# Patient Record
Sex: Male | Born: 1980 | Race: White | Hispanic: No | Marital: Single
Health system: Southern US, Community
[De-identification: ages and names within clinical notes are randomized; demographics above are authoritative.]

## PROBLEM LIST (undated history)

## (undated) ENCOUNTER — Emergency Department (HOSPITAL_COMMUNITY): Admission: EM | Payer: Self-pay | Source: Home / Self Care

## (undated) DIAGNOSIS — I10 Essential (primary) hypertension: Secondary | ICD-10-CM

---

## 2004-05-26 ENCOUNTER — Emergency Department: Payer: Self-pay | Admitting: Emergency Medicine

## 2004-07-06 ENCOUNTER — Emergency Department: Payer: Self-pay | Admitting: General Practice

## 2004-09-04 ENCOUNTER — Emergency Department: Payer: Self-pay | Admitting: General Practice

## 2007-07-07 ENCOUNTER — Other Ambulatory Visit: Payer: Self-pay

## 2007-07-07 ENCOUNTER — Emergency Department: Payer: Self-pay | Admitting: Emergency Medicine

## 2008-01-06 ENCOUNTER — Other Ambulatory Visit: Payer: Self-pay

## 2008-01-06 ENCOUNTER — Inpatient Hospital Stay: Payer: Self-pay | Admitting: Internal Medicine

## 2008-01-06 ENCOUNTER — Ambulatory Visit: Payer: Self-pay | Admitting: Cardiology

## 2008-02-26 ENCOUNTER — Emergency Department (HOSPITAL_COMMUNITY): Admission: EM | Admit: 2008-02-26 | Discharge: 2008-02-26 | Payer: Self-pay | Admitting: Emergency Medicine

## 2008-03-24 ENCOUNTER — Other Ambulatory Visit: Payer: Self-pay

## 2008-03-24 ENCOUNTER — Inpatient Hospital Stay: Payer: Self-pay | Admitting: Unknown Physician Specialty

## 2011-06-18 ENCOUNTER — Emergency Department: Payer: Self-pay | Admitting: Emergency Medicine

## 2011-07-13 ENCOUNTER — Emergency Department: Payer: Self-pay | Admitting: *Deleted

## 2011-07-13 LAB — COMPREHENSIVE METABOLIC PANEL
Alkaline Phosphatase: 30 U/L — ABNORMAL LOW (ref 50–136)
Anion Gap: 9 (ref 7–16)
BUN: 12 mg/dL (ref 7–18)
Bilirubin,Total: 0.7 mg/dL (ref 0.2–1.0)
Co2: 30 mmol/L (ref 21–32)
Creatinine: 1.03 mg/dL (ref 0.60–1.30)
EGFR (African American): >60
EGFR (Non-African Amer.): >60
Osmolality: 287 (ref 275–301)
SGPT (ALT): 18 U/L

## 2011-07-13 LAB — CBC WITH DIFFERENTIAL/PLATELET
Basophil %: 0.4 %
Eosinophil %: 6 %
Eosinophil: 7 %
Lymphocytes: 30 %
MCH: 24.1 pg — ABNORMAL LOW (ref 26.0–34.0)
MCV: 76 fL — ABNORMAL LOW (ref 80–100)
Monocyte %: 10.1 %
Neutrophil %: 50.4 %
Platelet: 277 10*3/uL (ref 150–440)
Segmented Neutrophils: 57 %

## 2011-07-13 LAB — LIPASE, BLOOD: Lipase: 61 U/L — ABNORMAL LOW (ref 73–393)

## 2011-07-13 LAB — RETICULOCYTES
Absolute Retic Count: 0.126 10*6/uL — ABNORMAL HIGH (ref 0.024–0.084)
Reticulocyte: 2.3 % — ABNORMAL HIGH (ref 0.5–1.5)

## 2011-07-16 ENCOUNTER — Ambulatory Visit: Payer: Self-pay | Admitting: Urology

## 2011-07-16 LAB — CBC WITH DIFFERENTIAL/PLATELET
Basophil %: 0.2 %
Eosinophil %: 0.4 %
HGB: 13.4 g/dL (ref 13.0–18.0)
Lymphocyte %: 15.4 %
Lymphocytes: 20 %
MCH: 24 pg — ABNORMAL LOW (ref 26.0–34.0)
Monocyte #: 0.8 10*3/uL — ABNORMAL HIGH (ref 0.0–0.7)
Monocyte %: 6.7 %
Neutrophil %: 77.3 %
RBC: 5.57 10*6/uL (ref 4.40–5.90)

## 2011-07-16 LAB — RETICULOCYTES
Absolute Retic Count: 0.13 10*6/uL — ABNORMAL HIGH (ref 0.024–0.084)
Reticulocyte: 2.3 % — ABNORMAL HIGH (ref 0.5–1.5)

## 2011-07-16 LAB — URINALYSIS, COMPLETE
Bilirubin,UR: NEGATIVE
Ketone: NEGATIVE
Ph: 7 (ref 4.5–8.0)
Protein: NEGATIVE
RBC,UR: 1 /HPF (ref 0–5)
Specific Gravity: 1.01 (ref 1.003–1.030)
Squamous Epithelial: NONE SEEN

## 2011-07-16 LAB — COMPREHENSIVE METABOLIC PANEL
Albumin: 4.1 g/dL (ref 3.4–5.0)
Alkaline Phosphatase: 33 U/L — ABNORMAL LOW (ref 50–136)
Bilirubin,Total: 1.2 mg/dL — ABNORMAL HIGH (ref 0.2–1.0)
Co2: 28 mmol/L (ref 21–32)
Glucose: 92 mg/dL (ref 65–99)
SGPT (ALT): 15 U/L
Sodium: 141 mmol/L (ref 136–145)
Total Protein: 8.5 g/dL — ABNORMAL HIGH (ref 6.4–8.2)

## 2011-07-19 ENCOUNTER — Emergency Department: Payer: Self-pay | Admitting: Emergency Medicine

## 2012-05-20 ENCOUNTER — Emergency Department (HOSPITAL_COMMUNITY)
Admission: EM | Admit: 2012-05-20 | Discharge: 2012-05-21 | Disposition: A | Discharge status: Home / Self Care | Payer: Self-pay | Attending: Emergency Medicine | Admitting: Emergency Medicine

## 2012-05-20 ENCOUNTER — Emergency Department (HOSPITAL_COMMUNITY): Payer: Self-pay

## 2012-05-20 ENCOUNTER — Encounter (HOSPITAL_COMMUNITY): Payer: Self-pay | Admitting: *Deleted

## 2012-05-20 DIAGNOSIS — S1191XA Laceration without foreign body of unspecified part of neck, initial encounter: Secondary | ICD-10-CM

## 2012-05-20 DIAGNOSIS — X789XXA Intentional self-harm by unspecified sharp object, initial encounter: Secondary | ICD-10-CM | POA: Insufficient documentation

## 2012-05-20 DIAGNOSIS — S1190XA Unspecified open wound of unspecified part of neck, initial encounter: Secondary | ICD-10-CM | POA: Insufficient documentation

## 2012-05-20 HISTORY — DX: Essential (primary) hypertension: I10

## 2012-05-20 LAB — BASIC METABOLIC PANEL
BUN: 9 mg/dL (ref 6–23)
CO2: 27 mEq/L (ref 19–32)
Calcium: 9.4 mg/dL (ref 8.4–10.5)
Chloride: 107 mEq/L (ref 96–112)
Creatinine, Ser: 1.06 mg/dL (ref 0.50–1.35)
GFR calc Af Amer: >90 mL/min (ref >90)
GFR calc non Af Amer: >90 mL/min (ref >90)
Glucose, Bld: 82 mg/dL (ref 70–99)
Potassium: 4.1 mEq/L (ref 3.5–5.1)
Sodium: 144 mEq/L (ref 135–145)

## 2012-05-20 LAB — CBC
HCT: 42.1 % (ref 39.0–52.0)
Hemoglobin: 13.6 g/dL (ref 13.0–17.0)
MCH: 24 pg — ABNORMAL LOW (ref 26.0–34.0)
MCHC: 32.3 g/dL (ref 30.0–36.0)
MCV: 74.4 fL — ABNORMAL LOW (ref 78.0–100.0)
Platelets: 200 10*3/uL (ref 150–400)
RBC: 5.66 MIL/uL (ref 4.22–5.81)
RDW: 13.4 % (ref 11.5–15.5)
WBC: 5 10*3/uL (ref 4.0–10.5)

## 2012-05-20 MED ORDER — SODIUM CHLORIDE 0.9 % IV BOLUS (SEPSIS)
1000.0000 mL | Freq: Once | INTRAVENOUS | Status: AC
  Administered 2012-05-20: 1000 mL via INTRAVENOUS

## 2012-05-20 MED ORDER — IOHEXOL 350 MG/ML SOLN
50.0000 mL | Freq: Once | INTRAVENOUS | Status: AC | PRN
  Administered 2012-05-20: 50 mL via INTRAVENOUS

## 2012-05-20 MED ORDER — TETANUS-DIPHTH-ACELL PERTUSSIS 5-2.5-18.5 LF-MCG/0.5 IM SUSP
0.5000 mL | Freq: Once | INTRAMUSCULAR | Status: DC
  Filled 2012-05-20: qty 0.5

## 2012-05-20 NOTE — ED Provider Notes (Signed)
History    31 year old male sent for evaluation of a laceration to his right neck. Self-inflicted. Patient was in police custody when he cut his neck. Patient does admit this. He says he used a razor. Does not feel short of breath. No stridor. No pulsatile bleeding noted by EMS. No hematoma. Bleeding controlled with direct pressure. No nausea or vomiting. Denies chest pain. Pt will not tell me why he did this. Reports 9 previous suicide attempts.   CSN: [id number]  Arrival date & time 05/20/12  1715   First MD Initiated Contact with Patient 05/20/12 1723      Chief Complaint  Patient presents with  . Laceration  . Suicide Attempt    (Consider location/radiation/quality/duration/timing/severity/associated sxs/prior treatment) HPI  Past Medical History  Diagnosis Date  . Hypertension     No past surgical history on file.  No family history on file.  History  Substance Use Topics  . Smoking status: Not on file  . Smokeless tobacco: Not on file  . Alcohol Use:       Review of Systems   Review of symptoms negative unless otherwise noted in HPI.   Allergies  Ibuprofen and Tylenol  Home Medications  No current outpatient prescriptions on file.  BP 127/85  Pulse 76  Resp 16  SpO2 100%  Physical Exam  Nursing note and vitals reviewed. Constitutional: He appears well-developed and well-nourished. No distress.  HENT:  Head: Normocephalic and atraumatic.       Proximally 6 cm laceration to the right anterior neck consistent with described mechanism. Does appear to violate the platysma. No active bleeding noted. No hematoma. No significant subcutaneous air. Trachea is midline. No stridor.   Eyes: Conjunctivae normal are normal. Pupils are equal, round, and reactive to light. Right eye exhibits no discharge. Left eye exhibits no discharge.  Neck: Neck supple.  Cardiovascular: Normal rate, regular rhythm and normal heart sounds.  Exam reveals no gallop and no friction  rub.   No murmur heard. Pulmonary/Chest: Effort normal and breath sounds normal. No respiratory distress.       Patient appears to be breathing comfortably. No increased work of breathing. Lungs are clear. No stridor or wheezing. No coughing during exam.  Abdominal: Soft. He exhibits no distension. There is no tenderness.  Musculoskeletal: He exhibits no edema and no tenderness.  Neurological: He is alert.  Skin: Skin is warm and dry.  Psychiatric: He has a normal mood and affect. His behavior is normal. Thought content normal.    ED Course  Procedures (including critical care time)  LACERATION REPAIR Performed by: Raeford Razor Authorized by: Raeford Razor Consent: Verbal consent obtained. Risks and benefits: risks, benefits and alternatives were discussed Consent given by: patient Patient identity confirmed: provided demographic data Prepped and Draped in normal sterile fashion Wound explored  Laceration Location: R anterior neck  Laceration Length: 6 cm  No Foreign Bodies seen or palpated  Anesthesia: local infiltration  Local anesthetic: lidocaine 2% w epinephrine  Anesthetic total: 6 ml  Irrigation method: syringe Amount of cleaning: standard  Skin closure: layered  Number of sutures: 11  Technique: 1 suture deep to better approximate wound margins with 4-0 chromic. 10 simple interrupted to close skin with 5-0 prolene  Patient tolerance: Patient tolerated the procedure well with no immediate complications.   Labs Reviewed  CBC - Abnormal; Notable for the following:    MCV 74.4 (*)     MCH 24.0 (*)     All other  components within normal limits  BASIC METABOLIC PANEL   No results found.   1. Laceration of neck       MDM  30:29 PM 31 year old male self-inflicted lacerations the right neck. No respiratory distress on exam. No physical exam signs of significant vascular injury, but given that wound does appear to violate platysma, will obtain a CT  angiogram of his neck. Will update tetanus. If CT is reassuring, plain copious irrigation and primary closure.  CT without evidence of vascular injury. Wound irrigated and closed. Pt is being released back to jail escorted by prison officer.        Raeford Razor, MD 05/20/12 [phone]

## 2012-05-20 NOTE — ED Notes (Addendum)
Sheriff  AT BEDSIDE

## 2012-05-20 NOTE — ED Notes (Signed)
Family at bedside. 

## 2012-05-20 NOTE — ED Notes (Signed)
PT was in custody and had a witnessed attempt to cut his throat . EMS was called to [facility] to transport to ED. Bleeding from Lac controlled on arrival to ED.

## 2012-05-20 NOTE — ED Notes (Signed)
I gave the patient a happy meal, 2 packs of graham crackers, and 4 containers of orange juice.

## 2012-05-20 NOTE — ED Notes (Signed)
Iv team called to start Iv for CT after 2 RN's attempted start . The EDP requested IV team called for start.

## 2012-05-21 NOTE — ED Notes (Signed)
incision to right neck clean dry and suture intact, no bleeding noted.

## 2014-07-31 IMAGING — CT CT ANGIO NECK
1 of 9 series · 5 of 33 positions shown · IV contrast (CONTRAST)
Comparison: None.

CLINICAL DATA: Inmate with bleeding from a self-inlficted neck
laceration.

CT ANGIOGRAPHY NECK
TECHNIQUE: Multidetector CT imaging of the neck was performed
using the standard protocol during bolus administration of
intravenous contrast.  Multiplanar CT image reconstructions
including MIPs were obtained to evaluate the vascular anatomy.
Carotid stenosis measurements (when applicable) are obtained
utilizing NASCET criteria, using the distal internal carotid
diameter as the denominator.
Contrast: 50mL OMNIPAQUE IOHEXOL 350 MG/ML SOLN

[mpr, ax 1x1 mpr, axial · axial · 0.43mm/px · z∈[+130,+284]mm · 5 of 232 slices shown]
[im 39/232  soft-tissue]
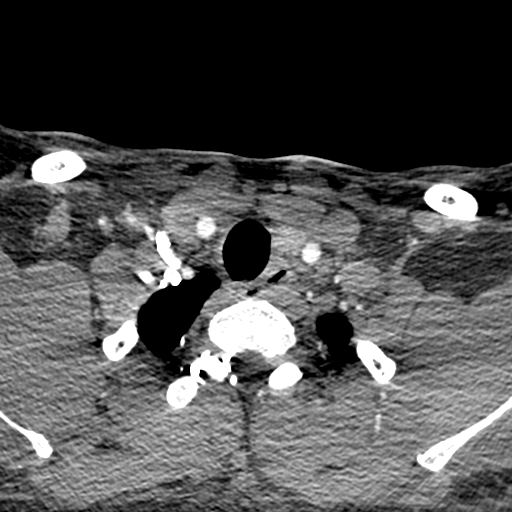
[im 78/232  bone]
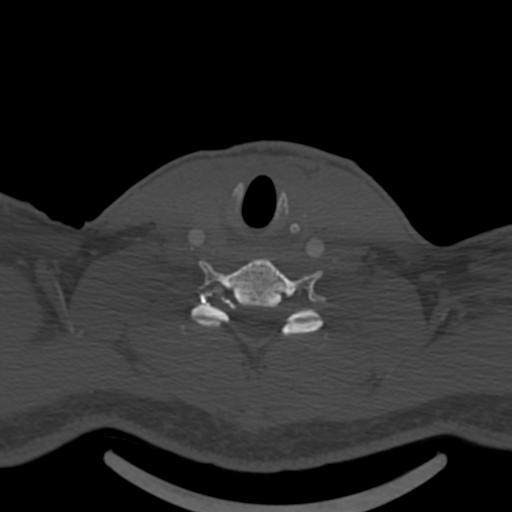
[im 116/232  soft-tissue]
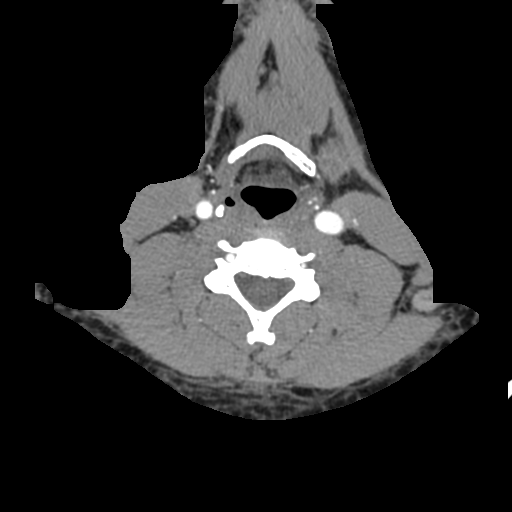
[im 155/232  bone]
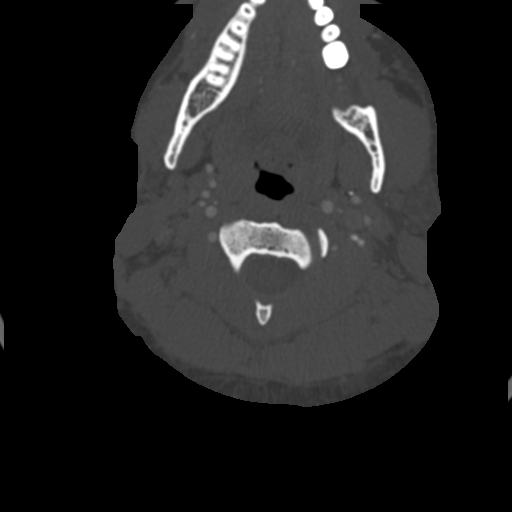
[im 193/232  soft-tissue]
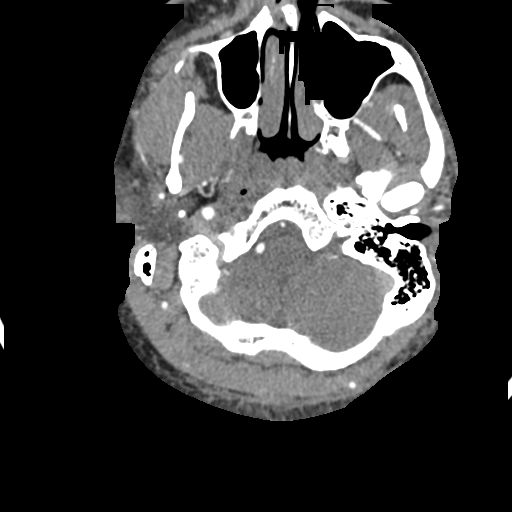

[5 of 33 positions shown; findings below may reference images not displayed]

FINDINGS: On initial injection of contrast and saline, the
contrast and saline extravasated into the patient's upper left arm.
He had some pain during injection with no pain other than some
tenderness to palpation following the injection.  The distal pulses
were normal at that time.  The patient was treated with elevation
of the arm in the [HOSPITAL] and transferred back to the
emergency department for establishment of better intravenous
access.  Once this was performed, the study was repeated
successfully with an additional 50 ml of Omnipaque 350.

A laceration is demonstrated in the lateral aspect of the mid neck
on the right, extending to the superficial aspect of the underlying
sternocleidomastoid muscle.  There is a minimal defect in the
superficial aspect of the muscle at that location.  The arteries in
the neck and skull base have normal appearances with no contrast
extravasation or dissection seen.  A dominant right vertebral
artery is demonstrated.  Both vertebral arteries terminate in a
normal appearing basilar artery.  No plaque formation is seen.  The
bones appear normal.

 Review of the MIP images confirms the above findings.
IMPRESSION: Right lateral neck laceration without arterial injury or active
extravasation of contrast.

## 2014-10-06 NOTE — Discharge Summary (Signed)
PATIENT NAME:  Greg Smith, Greg Smith MR#:  [id number] DATE OF BIRTH:  Mar 11, 1981  DATE OF ADMISSION:  07/16/2011 DATE OF DISCHARGE:  07/17/2011  PREOPERATIVE DIAGNOSIS: High flow priapism.   PROCEDURE: Penile corporal irrigation shunt procedure.   INDICATIONS: The patient is a 35 year old African American gentleman who developed priapism earlier in January. He had subsequent detumescence. He developed recurrent priapism earlier this week. He presented to the Emergency Room with partial detumescence. He was discharged back to his facility. He returned again on the early morning of 02/01 with recurrent priapism. The patient states that it has been essentially erect since he was here two days prior. 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 He has no significant history of trauma. He denies any injection therapy or oral medications. He is currently incarcerated at a local prison facility.   HOSPITAL COURSE: Greg Smith was taken to the operating room on 07/16/2011 for a penile corporal irrigation. At the time of needle placement no significant blood return was noted. When blood return was obtained, it was more bright red and not the usual dark red of low flow priapism. Irrigation did result in subsequent partial detumescence. He had continued intermittent episodes of return erection. This was despite irrigation with phenylephrine and Neo-Synephrine. He then underwent a unilateral distal penile tip shunt with a biopsy gun. At least three fires were taken through the tip of the corporal shaft. This resulted in partial detumescence. His hospital course was significant for intermittent episodes of recurrent erection. He would vary between 50 and 75% of the erection rigidity he had prior to any intervention. He did have significant engorgement and swelling of the glans penis consistent with the irrigation and shunt procedure. This to be  expected. There is no evidence of ischemia. The tissue appears healthy throughout. The discomfort was improved. A Foley catheter was placed in the Operating Room without difficulty. Due to a large bladder volume the catheter was kept in place until postoperative day one. The catheter was then removed. The patient was improved compared to the presentation status. The findings are consistent with a probable high flow priapism. This will likely require interventional radiology for definitive treatment. The facilities are not available for this type of interventional radiology at [facility]. Referral to another center will be necessary. At present the degree of tumescence is manageable with further emergent intervention not absolutely indicated. The option of a more definitive shunt procedure has been discussed. Given the high flow nature and moderate control of the erections we have elected to hold on further intervention pending evaluation for more definitive intervention. He is in agreement with this course of action. We have elected to discharge the patient back to the jail facility. He is to avoid any manipulation. It is expected he will have continued stuttering episodes of tumescence and detumescence. If he develops progressive rigidity, bleeding, or other issues, return to the Emergency Room preferably at [facility] would be recommended since the facilities are not available at [facility] for definitive treatment. The only option that could be offered would be a more definitive shunt procedure. He understands this aspect. He was discharged on Percocet 10 mg one every 4 to 6 hours as needed for pain, Keflex 500 mg twice daily for five days. He was previously given terbutaline and Sudafed to utilize as needed. This is reasonable if worsening occurs, although it is likely to be of low benefit at present. He is also  to continue his clonidine for his blood pressure. Good blood pressure control is also indicated in this situation. We  [facility].  They are to notify us if there are any further problems or questions in the interim.    ____________________________ Madolyn FriezeBrian S. Achilles Dunkope, MD bsc:bjt D: 07/17/2011 11:54:13 ET T: 07/19/2011 11:08:26 ET JOB#: [id number]  cc: Madolyn FriezeBrian S. Achilles Dunkope, MD, <Dictator> Madolyn FriezeBRIAN S Galen Malkowski MD ELECTRONICALLY SIGNED 07/20/2011 7:21

## 2014-10-06 NOTE — Op Note (Signed)
PATIENT NAME:  Greg Smith, Greg Smith MR#:  [id number] DATE OF BIRTH:  11-01-1980  DATE OF PROCEDURE:  07/16/2011  PREOPERATIVE DIAGNOSIS: Priapism.   POSTOPERATIVE DIAGNOSIS: Priapism.   PROCEDURE: Penile corporal irrigation shunt procedure.   SURGEON: Greg Smith. Achilles Dunk, MD  ANESTHESIA: General endotracheal anesthesia.   INDICATIONS: The patient is a 34 year old African American gentleman who had his first episode of priapism earlier in January. He had spontaneous resolution and was discharged from the Emergency Room. He returned approximately three days prior with recurrent priapism. He was treated with oral medications with partial detumescence. He elected to leave the Emergency Room before further intervention. He presented back to the Emergency Room earlier this morning with persistent priapism. He has apparently had ongoing priapism since his recent evaluation at more than 48 hours. He has tenderness but not severe pain related to the priapism. The shaft of the penis is rigid with no fullness noted within the glans penis. This is concerning for possible high flow priapism. He has no recent history of trauma. He denies the use of any oral medications or injections. He is currently a prisoner in the local jail system.   DESCRIPTION OF PROCEDURE: After informed consent was obtained, the patient was taken to the Operating Room and placed in the supine position on the operating table. The patient was prepped and draped in the standard fashion. An 18-gauge Angiocath was placed first in the right corpora mid shaft and a second in the mid corpora left shaft. No significant blood return was initially received. With some manipulation moderate bright red blood was visualized. An attempt was made at aspiration which was unsuccessful. Irrigation was then performed utilizing 4 mg of Neo-Synephrine in 1000 mL of saline. The initial irrigation was very difficult. This was somewhat due to the rigidity of the penis.  Subsequent irrigation progressed with moderate dark blood irrigated from the opposite side. Partial detumescence was obtained only in the area of irrigation. The base of the penis remained firm. The more distal aspect of the penis also remained firm. A second Angiocath was placed on the left at the base. A large amount of initially bright and then subsequent dark blood was returned. Very prompt detumescence occurred. Additional irrigation was performed with approximately 500 mL. There were intermittent episodes of partial rigidity with subsequent reduction. The decision was made at that time to hold on further irrigation. In monitoring the penis there was approximate three-quarter return of the erection. 5 mg of phentolamine was injected at the base of the penis on the right. There was some degree of detumescence. Additional irrigation was then performed. An additional 5 mg of phentolamine was injected in the left corpora also with partial recurrent detumescence. With additional irrigation complete detumescence was achieved. As the penis was observed partial retumescence occurred. Another needle was placed near the distal aspect of the corpora on the left. A significant amount of dark blood was able to be irrigated free. Complete detumescence was once again achieved. The irrigation Angiocaths were then removed. Pressure was applied to the penis after as much drainage from the sites as possible. As the penis was being readied for dressing, three-quarter erection occurred once again. The patient had stated he did not want any cutting of his penis. The option of a shunt had been discussed. We therefore elected to proceed with a needle biopsy shunt of the glans penis. This was performed on the right. A significant amount of blood was evacuated from the penis with prompt detumescence.  The decision was made to hold on only a unilateral shunt. The penis was then wrapped in Kling and Coban dressing. A Monocryl suture was  placed at the shunt site. Approximately 50% erection occurred once again with catheter placement. The erection once again detumesced, it was in a relative stuttering pattern. The decision was made to hold on any further intervention to evaluate response to the current therapy. The catheter was placed to gravity drainage. The patient was awakened from general endotracheal anesthesia, was taken to recovery room in stable condition. There were no problems or further complications. The patient tolerated the procedure well.   ____________________________ Greg FriezeBrian S. Achilles Dunkope, MD bsc:cms D: 07/16/2011 16:25:28 ET T: 07/16/2011 17:00:16 ET JOB#: [id number]  cc: Greg FriezeBrian S. Achilles Dunkope, MD, <Dictator> Greg FriezeBRIAN S [name] MD ELECTRONICALLY SIGNED 07/20/2011 7:21

## 2014-10-06 NOTE — H&P (Signed)
PATIENT NAME:  Greg Smith, Avion A MR#:  [id number] DATE OF BIRTH:  26-Jul-1980  DATE OF ADMISSION:  07/16/2011  HISTORY: Mr. Greg Smith is a 34 year old African American gentleman who is currently incarcerated at a local facility. He had his initial episode of priapism January 1. There was spontaneous detumescence without any further intervention, only oral medications were utilized. He presented to the Emergency Room approximately three days prior with recurrent priapism. He had moderate improvement with oral medication. He elected to be discharged prior to any further intervention. He returned to the Emergency Room earlier this morning with persistent priapism that he states has been present really since his evaluation earlier in the week. This is more than 48 hours. He does have some tenderness of the penis. The shaft is noted to be very rigid with no fullness noted of the glans penis. This is concerning for possible high flow priapism. He has no recent history of trauma. He has no history of any bleeding disorders. The current blood work demonstrates no significant abnormalities. A noncontrast CT scan of the pelvis demonstrates no gross abnormalities. He did have a moderate bladder residual. He is able to void, however, to a minimal residual. There was some initial concern of possible urinary retention, this has not been demonstrated. He denies any injection therapy or oral medication use. Given the current findings, we have elected to proceed with corporal irrigation. He did receive terbutaline and Sudafed in the Emergency Room with no benefit at this time.   PAST MEDICAL HISTORY:  1. Borderline diabetes mellitus. 2. Borderline hypertension. He is on no medications for these at present.   ALLERGIES: Ibuprofen and Tylenol.   SOCIAL HISTORY: Noncontributory.   FAMILY HISTORY: Noncontributory for sickle cell disease or trait.   MEDICATIONS ON ADMISSION: None except for terbutaline previously given as well  as Sudafed.   PHYSICAL EXAMINATION:  VITAL SIGNS: Vital signs stable.   HEENT: Within normal limits.   CHEST: Clear to auscultation bilaterally.   CARDIOVASCULAR: Regular rate and rhythm.   ABDOMEN: Soft, nontender, nondistended. No palpable masses.   GENITOURINARY: External genitalia demonstrates a circumcised penis with profound rigidity of the shaft with no fullness of the glans penis. It was moderately tender to palpation. There was no evidence of any ischemia. Testes bilaterally descended without masses.   EXTREMITIES: Free range of motion x4.   NEUROLOGIC: Motor and sensory grossly intact.   ASSESSMENT: Priapism, possible high flow.   RECOMMENDATION: The patient has been informed of the situation. The need for corporal irrigation as well as risks and benefits have been discussed in detail. The possible need for shunt procedure was also discussed. The patient stated that he did not wish his penis to be cut. Attempts will be made at as minimal of intervention as possible for treatment of the priapism. He agrees to proceed. He has been consented for the procedure.    ____________________________ Madolyn FriezeBrian S. Achilles Dunkope, MD bsc:cms D: 07/16/2011 16:30:30 ET T: 07/16/2011 16:46:18 ET JOB#: [id number]  cc: Madolyn FriezeBrian S. Achilles Dunkope, MD, <Dictator> Madolyn FriezeBRIAN S Yogi Arther MD ELECTRONICALLY SIGNED 07/20/2011 7:21
